# Patient Record
Sex: Male | Born: 1975 | Race: White | Hispanic: No | Marital: Married | Smoking: Never smoker
Health system: Southern US, Community
[De-identification: ages and names within clinical notes are randomized; demographics above are authoritative.]

## PROBLEM LIST (undated history)

## (undated) DIAGNOSIS — N200 Calculus of kidney: Secondary | ICD-10-CM

## (undated) HISTORY — DX: Calculus of kidney: N20.0

## (undated) HISTORY — PX: NO PAST SURGERIES: SHX2092

---

## 2010-11-09 ENCOUNTER — Ambulatory Visit: Payer: Self-pay | Admitting: Family

## 2011-03-11 ENCOUNTER — Emergency Department: Payer: Self-pay | Admitting: Emergency Medicine

## 2011-03-11 LAB — BASIC METABOLIC PANEL
BUN: 15 mg/dL (ref 7–18)
Calcium, Total: 8.9 mg/dL (ref 8.5–10.1)
Chloride: 103 mmol/L (ref 98–107)
Co2: 26 mmol/L (ref 21–32)
Creatinine: 1.01 mg/dL (ref 0.60–1.30)
EGFR (Non-African Amer.): >60
Glucose: 124 mg/dL — ABNORMAL HIGH (ref 65–99)
Osmolality: 280 (ref 275–301)

## 2011-03-11 LAB — CBC
HCT: 46.5 % (ref 40.0–52.0)
HGB: 16.1 g/dL (ref 13.0–18.0)
MCHC: 34.7 g/dL (ref 32.0–36.0)
MCV: 93 fL (ref 80–100)
RDW: 13.2 % (ref 11.5–14.5)
WBC: 15.7 10*3/uL — ABNORMAL HIGH (ref 3.8–10.6)

## 2011-03-11 LAB — URINALYSIS, COMPLETE
Leukocyte Esterase: NEGATIVE
Nitrite: NEGATIVE
Ph: 5 (ref 4.5–8.0)
Protein: 30
RBC,UR: 58 /HPF (ref 0–5)

## 2011-03-15 ENCOUNTER — Ambulatory Visit: Payer: Self-pay | Admitting: Urology

## 2012-03-16 ENCOUNTER — Ambulatory Visit: Payer: Self-pay | Admitting: Urology

## 2012-12-15 ENCOUNTER — Ambulatory Visit: Payer: Self-pay | Admitting: Medical

## 2016-07-30 ENCOUNTER — Encounter: Payer: Self-pay | Admitting: Family Medicine

## 2016-07-30 ENCOUNTER — Ambulatory Visit (INDEPENDENT_AMBULATORY_CARE_PROVIDER_SITE_OTHER): Payer: 59 | Admitting: Family Medicine

## 2016-07-30 VITALS — BP 122/80 | HR 70 | Temp 98.6°F | Ht 68.5 in | Wt 211.5 lb

## 2016-07-30 DIAGNOSIS — D179 Benign lipomatous neoplasm, unspecified: Secondary | ICD-10-CM

## 2016-07-30 DIAGNOSIS — Z7189 Other specified counseling: Secondary | ICD-10-CM

## 2016-07-30 DIAGNOSIS — Z0000 Encounter for general adult medical examination without abnormal findings: Secondary | ICD-10-CM

## 2016-07-30 NOTE — Patient Instructions (Addendum)
Check to see if you had a Td or Tdap shot.  See if you can get me a copy of your old labs.   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   If bothersome then you can get it removed by the surgeons.  Take care.  Glad to see you.  I would get a flu shot each fall.

## 2016-07-31 ENCOUNTER — Encounter: Payer: Self-pay | Admitting: Family Medicine

## 2016-07-31 DIAGNOSIS — Z7189 Other specified counseling: Secondary | ICD-10-CM | POA: Insufficient documentation

## 2016-07-31 DIAGNOSIS — D179 Benign lipomatous neoplasm, unspecified: Secondary | ICD-10-CM | POA: Insufficient documentation

## 2016-07-31 DIAGNOSIS — Z0000 Encounter for general adult medical examination without abnormal findings: Secondary | ICD-10-CM | POA: Insufficient documentation

## 2016-07-31 NOTE — Assessment & Plan Note (Signed)
Advanced directive discussed with patient.  Wife designated if patient were incapacitated. ?

## 2016-07-31 NOTE — Assessment & Plan Note (Signed)
He had a tetanus shot in 2016. He can check to see if it is a TD or a Tdap.  He will check to see when he last had his lipids and sugar checked.  HIV screening discussed with patient. He will consider. Diet and exercise discussed with patient. Flu shot encouraged. Advanced directive discussed with patient. Wife designated if patient were incapacitated.

## 2016-07-31 NOTE — Progress Notes (Signed)
New patient to establish care. Skin lesion noted near the groin. Not painful. Present for 1-2 years. No change in size over that period of time. It does not change in size acutely when he coughs laughs or sneezes. No previous intervention. No previous evaluation. No skin drainage. No known injury.  Health maintenance issues.  He had a tetanus shot in 2016. He can check to see if it is a TD or a Tdap.  He will check to see when he last had his lipids and sugar checked.  HIV screening discussed with patient. He will consider. Diet and exercise discussed with patient. Flu shot encouraged. Advanced directive discussed with patient. Wife designated if patient were incapacitated.  PMH and SH reviewed  ROS: Per HPI unless specifically indicated in ROS section   Meds, vitals, and allergies reviewed.   GEN: nad, alert and oriented HEENT: mucous membranes moist NECK: supple w/o LA CV: rrr.  no murmur PULM: ctab, no inc wob ABD: soft, +bs EXT: no edema SKIN: no acute rash but long noted in the right inguinal/lower suprapubic area. It appears to be limited to the skin. It does not move with cough. . On hernia exam I do not feel the lesion move with cough

## 2016-07-31 NOTE — Assessment & Plan Note (Addendum)
I initially thought this was going to be a hernia but it feels more like a lipoma. It does not have the typical movement I would expect with a hernia. Either way he can observe it for now since it is not painful or causing significant symptoms. If it does become symptomatic we can refer him to general surgeon, that would apply if it were a lipoma or a hernia. Rationale discussed with patient. He agrees and will update me as needed. >30 minutes spent in face to face time with patient, >50% spent in counselling or coordination of care

## 2016-11-05 ENCOUNTER — Ambulatory Visit (INDEPENDENT_AMBULATORY_CARE_PROVIDER_SITE_OTHER): Payer: 59

## 2016-11-05 DIAGNOSIS — Z23 Encounter for immunization: Secondary | ICD-10-CM | POA: Diagnosis not present

## 2018-01-06 ENCOUNTER — Encounter: Payer: Self-pay | Admitting: Family Medicine

## 2018-01-06 ENCOUNTER — Ambulatory Visit: Payer: No Typology Code available for payment source | Admitting: Family Medicine

## 2018-01-06 DIAGNOSIS — R05 Cough: Secondary | ICD-10-CM | POA: Diagnosis not present

## 2018-01-06 DIAGNOSIS — R059 Cough, unspecified: Secondary | ICD-10-CM | POA: Insufficient documentation

## 2018-01-06 MED ORDER — BENZONATATE 200 MG PO CAPS: 200.0000 mg | ORAL_CAPSULE | Freq: Three times a day (TID) | ORAL | 1 refills | Status: DC | PRN

## 2018-01-06 NOTE — Assessment & Plan Note (Addendum)
He clearly feels better today.   Likely with a self resolving illness initially with residual dry cough, then a second illness in the last few days.   Likely a post infectious cough.  Should gradually get better over the next few weeks. Can last 6-8 weeks.   Use tessalon as needed.   Update me as needed.  Routine cautions given.  He agrees.   ddx d/w pt.

## 2018-01-06 NOTE — Patient Instructions (Signed)
Likely a post infectious cough.  Should gradually get better over the next few weeks. Can last 6-8 weeks.   Use tessalon as needed.   Update me as needed.  Take care.  Glad to see you.

## 2018-01-06 NOTE — Progress Notes (Signed)
Cough for about 3 weeks.  Some sputum production with cough initially.  Sputum resolved, cough remained.  No other sx at that point.  He thought it was eventually going to go away, but then had chills on 01/04/18 in the PM.  He hadn't felt sick right before that.  Then had a sweat that night, on 01/04/18.  Temp 99 that night.  He was fatigued 01/05/18, but clearly feels better today.  Still with dry cough.    Now, no FCNAVD.  No sputum production.  Appetite is normal.    Meds, vitals, and allergies reviewed.   ROS: Per HPI unless specifically indicated in ROS section   nad ncat Tm wnl Nasal exam w/o purulent discharge but scant dried blood on R medial nostril.   OP wnl Neck supple, no LA rrr ctab No wheeze, no rales, no rhonchi Ext w/o edema No BLE edema.

## 2019-03-30 ENCOUNTER — Other Ambulatory Visit: Payer: Self-pay | Admitting: Family Medicine

## 2019-03-30 ENCOUNTER — Other Ambulatory Visit: Payer: Self-pay

## 2019-03-30 ENCOUNTER — Other Ambulatory Visit (INDEPENDENT_AMBULATORY_CARE_PROVIDER_SITE_OTHER): Payer: Self-pay

## 2019-03-30 DIAGNOSIS — Z13220 Encounter for screening for lipoid disorders: Secondary | ICD-10-CM

## 2019-03-30 DIAGNOSIS — Z841 Family history of disorders of kidney and ureter: Secondary | ICD-10-CM

## 2019-03-30 LAB — BASIC METABOLIC PANEL
BUN: 16 mg/dL (ref 6–23)
CO2: 27 mEq/L (ref 19–32)
Calcium: 9.4 mg/dL (ref 8.4–10.5)
Chloride: 101 mEq/L (ref 96–112)
Creatinine, Ser: 1.01 mg/dL (ref 0.40–1.50)
GFR: 79.46 mL/min (ref >60.00)
Glucose, Bld: 157 mg/dL — ABNORMAL HIGH (ref 70–99)
Potassium: 4.1 mEq/L (ref 3.5–5.1)
Sodium: 137 mEq/L (ref 135–145)

## 2019-03-30 LAB — LIPID PANEL
Cholesterol: 234 mg/dL — ABNORMAL HIGH (ref 0–200)
HDL: 37.9 mg/dL — ABNORMAL LOW (ref >39.00)
LDL Cholesterol: 158 mg/dL — ABNORMAL HIGH (ref 0–99)
NonHDL: 195.93
Total CHOL/HDL Ratio: 6
Triglycerides: 191 mg/dL — ABNORMAL HIGH (ref 0.0–149.0)
VLDL: 38.2 mg/dL (ref 0.0–40.0)

## 2019-04-06 ENCOUNTER — Encounter: Payer: Self-pay | Admitting: Family Medicine

## 2019-04-06 ENCOUNTER — Other Ambulatory Visit: Payer: Self-pay

## 2019-04-06 ENCOUNTER — Ambulatory Visit (INDEPENDENT_AMBULATORY_CARE_PROVIDER_SITE_OTHER): Payer: BC Managed Care – PPO | Admitting: Family Medicine

## 2019-04-06 VITALS — BP 130/76 | HR 96 | Temp 96.4°F | Ht 68.5 in | Wt 219.2 lb

## 2019-04-06 DIAGNOSIS — Z0000 Encounter for general adult medical examination without abnormal findings: Secondary | ICD-10-CM | POA: Diagnosis not present

## 2019-04-06 DIAGNOSIS — K5900 Constipation, unspecified: Secondary | ICD-10-CM

## 2019-04-06 DIAGNOSIS — R739 Hyperglycemia, unspecified: Secondary | ICD-10-CM | POA: Diagnosis not present

## 2019-04-06 DIAGNOSIS — Z9189 Other specified personal risk factors, not elsewhere classified: Secondary | ICD-10-CM

## 2019-04-06 DIAGNOSIS — D179 Benign lipomatous neoplasm, unspecified: Secondary | ICD-10-CM

## 2019-04-06 DIAGNOSIS — Z7189 Other specified counseling: Secondary | ICD-10-CM

## 2019-04-06 LAB — GLUCOSE, RANDOM: Glucose, Bld: 161 mg/dL — ABNORMAL HIGH (ref 70–99)

## 2019-04-06 LAB — HEMOGLOBIN A1C: Hgb A1c MFr Bld: 5.6 % (ref 4.6–6.5)

## 2019-04-06 NOTE — Progress Notes (Signed)
This visit occurred during the SARS-CoV-2 public health emergency.  Safety protocols were in place, including screening questions prior to the visit, additional usage of staff PPE, and extensive cleaning of exam room while observing appropriate contact time as indicated for disinfecting solutions.  CPE- See plan.  Routine anticipatory guidance given to patient.  See health maintenance.  The possibility exists that previously documented standard health maintenance information may have been brought forward from a previous encounter into this note.  If needed, that same information has been updated to reflect the current situation based on today's encounter.    He had a tetanus shot in 2016. Flu shot encouraged. PNA and shingles not due Colon and prostate cancer screening not due, d/w pt.   HIV and HCV screening discussed with patient. He will consider. Diet and exercise discussed with patient.  Encouraged.  covid vaccine d/w pt.   Advanced directive discussed with patient. Wife designated if patient were incapacitated.  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    He has been trying conceive for the last 18 months.  He also has some ED noted.  He and his wife have a 36.70 year old son.    Hyperglycemia noted on labs.  He wasn't fasting for labs.  D/w pt.    Constipation with relief from miralax.  No blood in stool.  No abd pain.    PMH and SH reviewed  Meds, vitals, and allergies reviewed.   ROS: Per HPI.  Unless specifically indicated otherwise in HPI, the patient denies:  General: fever. Eyes: acute vision changes ENT: sore throat Cardiovascular: chest pain Respiratory: SOB GI: vomiting GU: dysuria Musculoskeletal: acute back pain Derm: acute rash Neuro: acute motor dysfunction Psych: worsening mood Endocrine: polydipsia Heme: bleeding Allergy: hayfever  GEN: nad, alert and oriented HEENT: ncat NECK: supple w/o LA CV: rrr. 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

## 2019-04-06 NOTE — Patient Instructions (Signed)
Go to the lab on the way out.   If you have mychart we'll likely use that to update you.    We'll call about the urology referral.  You may also get a call from the urology office.  Keep working on diet and exercise.   Your pulse was okay of recheck.   Take care.  Glad to see you.

## 2019-04-07 DIAGNOSIS — K5900 Constipation, unspecified: Secondary | ICD-10-CM | POA: Insufficient documentation

## 2019-04-07 DIAGNOSIS — R739 Hyperglycemia, unspecified: Secondary | ICD-10-CM | POA: Insufficient documentation

## 2019-04-07 DIAGNOSIS — Z9189 Other specified personal risk factors, not elsewhere classified: Secondary | ICD-10-CM | POA: Insufficient documentation

## 2019-04-07 NOTE — Assessment & Plan Note (Signed)
Advanced directive discussed with patient.  Wife designated if patient were incapacitated. ?

## 2019-04-07 NOTE — Assessment & Plan Note (Signed)
Sounds like he had relatively mild symptoms.  Continue work on diet and exercise and continue with as needed MiraLAX use.  Update me as needed.

## 2019-04-07 NOTE — Assessment & Plan Note (Signed)
He had a tetanus shot in 2016. Flu shot encouraged. PNA and shingles not due Colon and prostate cancer screening not due, d/w pt.   HIV and HCV screening discussed with patient. He will consider. Diet and exercise discussed with patient.  Encouraged.  covid vaccine d/w pt.   Advanced directive discussed with patient. Wife designated if patient were incapacitated.

## 2019-04-07 NOTE — Assessment & Plan Note (Signed)
Refer to urology.  He has mild ED noted.  Not at the point of needing treatment.  Discussed options.

## 2019-04-07 NOTE — Assessment & Plan Note (Signed)
Reassuring exam.  Update me as needed.  He agrees.

## 2019-04-07 NOTE — Assessment & Plan Note (Signed)
Discussed diet and exercise.  See notes on labs.

## 2019-04-08 ENCOUNTER — Telehealth: Payer: Self-pay | Admitting: Family Medicine

## 2019-04-08 DIAGNOSIS — Z9189 Other specified personal risk factors, not elsewhere classified: Secondary | ICD-10-CM

## 2019-04-08 NOTE — Telephone Encounter (Signed)
Received request from [facility] for Semen analysis results done from PCP before an appointment can be scheduled with Urologist. New patient appt was cancelled by [facility]. Request from [facility] placed in Dr Gutierrez's in box.

## 2019-04-08 NOTE — Telephone Encounter (Signed)
Analysis ordered to [facility] future order.  Results will determine [facility] vs [facility].

## 2019-05-03 DIAGNOSIS — Z9189 Other specified personal risk factors, not elsewhere classified: Secondary | ICD-10-CM | POA: Diagnosis not present

## 2019-05-03 LAB — SEMEN ANALYSIS, BASIC
Appearance: NORMAL
Concentration, Sperm: 73.3 x10E6/mL (ref >14.9)
Immotile Sperm: 72 %
Leukocyte Concentration: <1 x10E6/mL (ref <1.00)
Non-Progressive (NP): 9 %
Normal Morphology-Strict: 4 % (ref >3)
Progressive Motility (PR): 19 % — ABNORMAL LOW (ref >31)
Progressively Motile Sperm: 78.5 x10E6
Time Collected: 1420
Time Received: 1450
Time Since Last Emission: 10 days
Total Motile Sperm: 111.6 x10E6
Total Motility (PR+NP): 28 % — ABNORMAL LOW (ref >39)
Total Sperm in Ejaculate: 402.9 x10E6 (ref >38.9)
Volume: 5.5 mL (ref >1.4)
pH: 9 (ref >7.1)

## 2019-05-03 LAB — VIABILITY: Viability: 33 % — ABNORMAL LOW (ref >57)

## 2019-05-05 ENCOUNTER — Other Ambulatory Visit: Payer: Self-pay | Admitting: Family Medicine

## 2019-05-05 DIAGNOSIS — R869 Unspecified abnormal finding in specimens from male genital organs: Secondary | ICD-10-CM

## 2019-05-14 ENCOUNTER — Ambulatory Visit: Payer: Self-pay | Admitting: Urology

## 2019-05-18 ENCOUNTER — Other Ambulatory Visit: Payer: Self-pay | Admitting: Family Medicine

## 2019-05-18 DIAGNOSIS — R869 Unspecified abnormal finding in specimens from male genital organs: Secondary | ICD-10-CM

## 2019-05-28 ENCOUNTER — Ambulatory Visit: Payer: Self-pay | Admitting: Urology

## 2019-07-20 DIAGNOSIS — Z3141 Encounter for fertility testing: Secondary | ICD-10-CM | POA: Diagnosis not present

## 2019-07-20 DIAGNOSIS — E291 Testicular hypofunction: Secondary | ICD-10-CM | POA: Diagnosis not present

## 2019-08-25 DIAGNOSIS — I861 Scrotal varices: Secondary | ICD-10-CM | POA: Diagnosis not present

## 2019-10-12 DIAGNOSIS — Z20822 Contact with and (suspected) exposure to covid-19: Secondary | ICD-10-CM | POA: Diagnosis not present

## 2019-10-15 ENCOUNTER — Ambulatory Visit
Admission: EM | Admit: 2019-10-15 | Discharge: 2019-10-15 | Disposition: A | Discharge status: Home / Self Care | Payer: BC Managed Care – PPO | Attending: Family Medicine | Admitting: Family Medicine

## 2019-10-15 ENCOUNTER — Other Ambulatory Visit: Payer: Self-pay | Admitting: Primary Care

## 2019-10-15 ENCOUNTER — Other Ambulatory Visit: Payer: Self-pay

## 2019-10-15 ENCOUNTER — Telehealth: Payer: Self-pay

## 2019-10-15 DIAGNOSIS — U071 COVID-19: Secondary | ICD-10-CM | POA: Diagnosis not present

## 2019-10-15 DIAGNOSIS — R0602 Shortness of breath: Secondary | ICD-10-CM | POA: Diagnosis not present

## 2019-10-15 DIAGNOSIS — R5383 Other fatigue: Secondary | ICD-10-CM | POA: Diagnosis not present

## 2019-10-15 MED ORDER — PREDNISONE 10 MG (21) PO TBPK: ORAL_TABLET | Freq: Every day | ORAL | 0 refills | Status: AC

## 2019-10-15 MED ORDER — BENZONATATE 100 MG PO CAPS: 100.0000 mg | ORAL_CAPSULE | Freq: Three times a day (TID) | ORAL | 0 refills | Status: DC

## 2019-10-15 NOTE — Progress Notes (Signed)
I connected by phone with Kenneth White on 10/15/2019 at 5:29 PM to discuss the potential use of a new treatment for mild to moderate COVID-19 viral infection in non-hospitalized patients.  This patient is a 44 y.o. male that meets the FDA criteria for Emergency Use Authorization of COVID monoclonal antibody casirivimab/imdevimab or bamlanivimab/eteseviamb.  Has a (+) direct SARS-CoV-2 viral test result  Has mild or moderate COVID-19   Is NOT hospitalized due to COVID-19  Is within 10 days of symptom onset  Has at least one of the high risk factor(s) for progression to severe COVID-19 and/or hospitalization as defined in EUA.  Specific high risk criteria : BMI > 25   I have spoken and communicated the following to the patient or parent/caregiver regarding COVID monoclonal antibody treatment:  1. FDA has authorized the emergency use for the treatment of mild to moderate COVID-19 in adults and pediatric patients with positive results of direct SARS-CoV-2 viral testing who are 10 years of age and older weighing at least 40 kg, and who are at high risk for progressing to severe COVID-19 and/or hospitalization.  2. The significant known and potential risks and benefits of COVID monoclonal antibody, and the extent to which such potential risks and benefits are unknown.  3. Information on available alternative treatments and the risks and benefits of those alternatives, including clinical trials.  4. Patients treated with COVID monoclonal antibody should continue to self-isolate and use infection control measures (e.g., wear mask, isolate, social distance, avoid sharing personal items, clean and disinfect "high touch" surfaces, and frequent handwashing) according to CDC guidelines.   5. The patient or parent/caregiver has the option to accept or refuse COVID monoclonal antibody treatment.  After reviewing this information with the patient, the patient has agreed to receive one of the available  covid 19 monoclonal antibodies and will be provided an appropriate fact sheet prior to infusion. Pleas Koch, NP 10/15/2019 5:29 PM

## 2019-10-15 NOTE — Telephone Encounter (Signed)
Noted.  Thanks.  rx sent to staff re: possible infusion tx.

## 2019-10-15 NOTE — Telephone Encounter (Signed)
Noted  

## 2019-10-15 NOTE — Discharge Instructions (Signed)
I have sent in a prednisone taper for you to take for 6 days. 6 tablets on day one, 5 tablets on day two, 4 tablets on day three, 3 tablets on day four, 2 tablets on day five, and 1 tablet on day six.  I have also sent in [facility] for you to take 1 capsule every 8 hours as needed for cough  Your COVID test is pending.  You should self quarantine until the test result is back.    Take Tylenol as needed for fever or discomfort.  Rest and keep yourself hydrated.    Go to the emergency department if you develop acute worsening symptoms.

## 2019-10-15 NOTE — ED Provider Notes (Signed)
[place]   [id number] 10/15/19 Arrival Time: 2992   CC: COVID symptoms  SUBJECTIVE: History from: patient.  Kenneth White is a 44 y.o. male who presents with abrupt onset of fatigue, cough, tachycardia, shortness of breath for the last 3 days. Reports that he had a Covid test 4 days ago which resulted positive yesterday. Denies sick exposure to COVID, flu or strep. Denies recent travel. Currently has Covid. Has not completed Covid vaccines. Has not taken OTC medications for this. Symptoms are aggravated by activity. Denies previous symptoms in the past. Denies fever, chills, sinus pain, rhinorrhea, sore throat, wheezing, chest pain, nausea, changes in bowel or bladder habits.    ROS: As per HPI.  All other pertinent ROS negative.     Past Medical History:  Diagnosis Date  . Renal stone    Past Surgical History:  Procedure Laterality Date  . NO PAST SURGERIES     No Known Allergies No current facility-administered medications on file prior to encounter.   Current Outpatient Medications on File Prior to Encounter  Medication Sig Dispense Refill  . acetaminophen (TYLENOL) 500 MG tablet Take 500 mg by mouth every 6 (six) hours as needed.     Social History   Socioeconomic History  . Marital status: Unknown    Spouse name: Not on file  . Number of children: Not on file  . Years of education: Not on file  . Highest education level: Not on file  Occupational History  . Not on file  Tobacco Use  . Smoking status: Never Smoker  . Smokeless tobacco: Never Used  Substance and Sexual Activity  . Alcohol use: No  . Drug use: No  . Sexual activity: Not on file  Other Topics Concern  . Not on file  Social History Narrative   Remarried 2016. Son Zac born August 2017. 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   [employer] graduate.   Fan of [employer],  [employer]   Enjoys playing soccer   Social Determinants of Health   Financial Resource Strain:   . Difficulty of Paying Living Expenses: Not on file  Food Insecurity:   . Worried About Charity fundraiser in the Last Year: Not on file  . Ran Out of Food in the Last Year: Not on file  Transportation Needs:   . Lack of Transportation (Medical): Not on file  . Lack of Transportation (Non-Medical): Not on file  Physical Activity:   . Days of Exercise per Week: Not on file  . Minutes of Exercise per Session: Not on file  Stress:   . Feeling of Stress : Not on file  Social Connections:   . Frequency of Communication with Friends and Family: Not on file  . Frequency of Social Gatherings with Friends and Family: Not on file  . Attends Religious Services: Not on file  . Active Member of Clubs or Organizations: Not on file  . Attends Archivist Meetings: Not on file  . Marital Status: Not on file  Intimate Partner Violence:   . Fear of Current or Ex-Partner: Not on file  . Emotionally Abused: Not on file  . Physically Abused: Not on file  . Sexually Abused: Not on file   Family History  Problem Relation Age of Onset  . AAA (abdominal aortic aneurysm) Father   .  Renal Disease Father   . Hyperlipidemia Father   . Hypertension Father   . Colon cancer Neg Hx   . Prostate cancer Neg Hx     OBJECTIVE:  Vitals:   10/15/19 1101  BP: (!) 141/94  Pulse: (!) 113  Resp: 19  Temp: 99.7 F (37.6 C)  SpO2: 93%     General appearance: alert; appears fatigued, but nontoxic; speaking in full sentences and tolerating own secretions HEENT: NCAT; Ears: EACs clear, TMs pearly gray; Eyes: PERRL.  EOM grossly intact. Sinuses: nontender; Nose: nares patent without rhinorrhea, Throat: oropharynx clear, tonsils non erythematous or enlarged, uvula midline  Neck: supple without LAD Lungs: unlabored respirations, symmetrical air entry; cough: moderate; no respiratory distress;  CTAB Heart:tachycardic with regular rhythm. Radial pulses 2+ symmetrical bilaterally Skin: warm and dry Psychological: alert and cooperative; normal mood and affect  LABS:  No results found for this or any previous visit (from the past 24 hour(s)).   ASSESSMENT & PLAN:  1. COVID-19   2. Other fatigue   3. SOB (shortness of breath)     Meds ordered this encounter  Medications  . predniSONE (STERAPRED UNI-PAK 21 TAB) 10 MG (21) TBPK tablet    Sig: Take by mouth daily for 6 days. Take 6 tablets on day 1, 5 tablets on day 2, 4 tablets on day 3, 3 tablets on day 4, 2 tablets on day 5, 1 tablet on day 6    Dispense:  21 tablet    Refill:  0    Order Specific Question:   Supervising Provider    Answer:   Chase Picket [id number]  . benzonatate (TESSALON) 100 MG capsule    Sig: Take 1 capsule (100 mg total) by mouth every 8 (eight) hours.    Dispense:  21 capsule    Refill:  0    Order Specific Question:   Supervising Provider    Answer:   Chase Picket [id number]    Prescribed steroid taper Prescribed Tessalon Perles Gave incentive spirometer to help maintain lung conditioning Work note provided COVID testing ordered.  It will take between 1-2 days for test results.  Someone will contact you regarding abnormal results.    Patient should remain in quarantine until they have received Covid results.  If negative you may resume normal activities (go back to work/school) while practicing hand hygiene, social distance, and mask wearing.  If positive, patient should remain in quarantine for 10 days from symptom onset AND greater than 72 hours after symptoms resolution (absence of fever without the use of fever-reducing medication and improvement in respiratory symptoms), whichever is longer Get plenty of rest and push fluids Use OTC zyrtec for nasal congestion, runny nose, and/or sore throat Use OTC flonase for nasal congestion and runny nose Use medications daily for symptom  relief Use OTC medications like ibuprofen or tylenol as needed fever or pain Call or go to the ED if you have any new or worsening symptoms such as fever, worsening cough, shortness of breath, chest tightness, chest pain, turning blue, changes in mental status.  Reviewed expectations re: course of current medical issues. Questions answered. Outlined signs and symptoms indicating need for more acute intervention. Patient verbalized understanding. After Visit Summary given.         Faustino Congress, NP 10/16/19 1512

## 2019-10-15 NOTE — Telephone Encounter (Signed)
Pt seen at [facility] placed on prednisone course. Anda Kraft has called to discuss mAb infusion.

## 2019-10-15 NOTE — ED Triage Notes (Signed)
Patient sent here by [facility].   Patient reports he did a PCR covid test at [facility] on 9/29, which came back positive today. Patient called his primary care office, who instructed him to come to [facility].

## 2019-10-15 NOTE — Telephone Encounter (Signed)
Noted. Would call for update on [facility] eval. If didn't go, and not dyspneic may be ok to manage at home without needing [facility] eval.  Would offer mAb infusion as he would qualify based on weight.

## 2019-10-15 NOTE — Telephone Encounter (Signed)
Pt left v/m that he tested + for covid on 10/13/19 with [facility]; pt did not do rapid test but did do the 2 day test. Pt had fever 101 on 10/14/19; pt took tylenol and fever went down; pt has not taken temp today. On 10/09/19 pt started with dry cough and today has prod cough with yellow phlegm; pt taking zinc and Vit D and mucinex DM; pt has loss of smell and diarrhea recently. No CP or SOB or wheezing. In last 14 days no know exposure to + covid; pt has not had vaccines for covid; No distress with breathing. No available appts at [facility] and pt will go to [facility] in [place] so can have face to face and eval and listen to pts lungs; pt was advised to drink plenty of fluids, rest,tylenol for fever and self quarantining for 14 days. Pt voiced understanding. Pt will call first of wk with update on condition. Sending note to Dr Damita Dunnings as PCP and DR G who is in office.

## 2019-10-16 LAB — SARS-COV-2, NAA 2 DAY TAT

## 2019-10-16 LAB — NOVEL CORONAVIRUS, NAA: SARS-CoV-2, NAA: DETECTED — AB

## 2019-10-17 ENCOUNTER — Ambulatory Visit (HOSPITAL_COMMUNITY)
Admission: RE | Admit: 2019-10-17 | Discharge: 2019-10-17 | Disposition: A | Discharge status: Home / Self Care | Payer: BC Managed Care – PPO | Source: Ambulatory Visit | Attending: Pulmonary Disease | Admitting: Pulmonary Disease

## 2019-10-17 DIAGNOSIS — U071 COVID-19: Secondary | ICD-10-CM | POA: Diagnosis not present

## 2019-10-17 MED ORDER — METHYLPREDNISOLONE SODIUM SUCC 125 MG IJ SOLR: 125.0000 mg | Freq: Once | INTRAMUSCULAR | Status: DC | PRN

## 2019-10-17 MED ORDER — EPINEPHRINE 0.3 MG/0.3ML IJ SOAJ: 0.3000 mg | Freq: Once | INTRAMUSCULAR | Status: DC | PRN

## 2019-10-17 MED ORDER — SODIUM CHLORIDE 0.9 % IV SOLN
1200.0000 mg | Freq: Once | INTRAVENOUS | Status: AC
  Administered 2019-10-17: 1200 mg via INTRAVENOUS

## 2019-10-17 MED ORDER — FAMOTIDINE IN NACL 20-0.9 MG/50ML-% IV SOLN: 20.0000 mg | Freq: Once | INTRAVENOUS | Status: DC | PRN

## 2019-10-17 MED ORDER — DIPHENHYDRAMINE HCL 50 MG/ML IJ SOLN: 50.0000 mg | Freq: Once | INTRAMUSCULAR | Status: DC | PRN

## 2019-10-17 MED ORDER — ALBUTEROL SULFATE HFA 108 (90 BASE) MCG/ACT IN AERS: 2.0000 | INHALATION_SPRAY | Freq: Once | RESPIRATORY_TRACT | Status: DC | PRN

## 2019-10-17 MED ORDER — SODIUM CHLORIDE 0.9 % IV SOLN: INTRAVENOUS | Status: DC | PRN

## 2019-10-17 NOTE — Progress Notes (Signed)
  Diagnosis: COVID-19  Physician: Dr. Joya Gaskins   Procedure: Covid Infusion Clinic Med: casirivimab\imdevimab infusion - Provided patient with casirivimab\imdevimab fact sheet for patients, parents and caregivers prior to infusion.  Complications: No immediate complications noted.  Discharge: Discharged home   Claudia Desanctis 10/17/2019

## 2019-10-17 NOTE — Discharge Instructions (Signed)

## 2019-10-18 ENCOUNTER — Telehealth: Payer: Self-pay | Admitting: *Deleted

## 2019-10-18 NOTE — Telephone Encounter (Signed)
Spoke with pt relaying Dr. Synthia Innocent message.  Pt verbalizes understanding and will continue prednisone.  States he is feeling a lot better since the infusion.

## 2019-10-18 NOTE — Telephone Encounter (Signed)
Patient called stating that he went to the Urgent Care and was told to call back to let you know how the visit went. Patient stated that he was told that his lungs sound good. Patient stated that he went to the UC because he was given Prednisone and his heart rate went up. Patient stated that he is on Prednisone and wants to know if he can stop it or should he continue it?

## 2019-10-18 NOTE — Telephone Encounter (Signed)
Noted. Thanks.

## 2019-10-18 NOTE — Telephone Encounter (Signed)
Racing heart can be a common side effect of the prednisone or it could be the COVID causing fast heart rate. Would probably recommend continuing prednisone prescribed by [facility].   How is he feeling from [place]?

## 2019-10-18 NOTE — Telephone Encounter (Signed)
Fyi to PCP.

## 2019-10-23 DIAGNOSIS — Z20822 Contact with and (suspected) exposure to covid-19: Secondary | ICD-10-CM | POA: Diagnosis not present

## 2019-12-02 DIAGNOSIS — Z20822 Contact with and (suspected) exposure to covid-19: Secondary | ICD-10-CM | POA: Diagnosis not present

## 2021-05-07 ENCOUNTER — Ambulatory Visit (INDEPENDENT_AMBULATORY_CARE_PROVIDER_SITE_OTHER): Payer: 59 | Admitting: Nurse Practitioner

## 2021-05-07 ENCOUNTER — Ambulatory Visit (INDEPENDENT_AMBULATORY_CARE_PROVIDER_SITE_OTHER)
Admission: RE | Admit: 2021-05-07 | Discharge: 2021-05-07 | Disposition: A | Discharge status: Home / Self Care | Payer: 59 | Source: Ambulatory Visit | Attending: Nurse Practitioner | Admitting: Nurse Practitioner

## 2021-05-07 ENCOUNTER — Encounter: Payer: Self-pay | Admitting: Nurse Practitioner

## 2021-05-07 VITALS — BP 144/94 | HR 90 | Temp 96.9°F | Resp 14 | Ht 68.5 in | Wt 221.5 lb

## 2021-05-07 DIAGNOSIS — M79675 Pain in left toe(s): Secondary | ICD-10-CM | POA: Diagnosis not present

## 2021-05-07 DIAGNOSIS — M79672 Pain in left foot: Secondary | ICD-10-CM

## 2021-05-07 LAB — COMPREHENSIVE METABOLIC PANEL
ALT: 69 U/L — ABNORMAL HIGH (ref 0–53)
AST: 29 U/L (ref 0–37)
Albumin: 4.3 g/dL (ref 3.5–5.2)
Alkaline Phosphatase: 96 U/L (ref 39–117)
BUN: 15 mg/dL (ref 6–23)
CO2: 28 mEq/L (ref 19–32)
Calcium: 9.5 mg/dL (ref 8.4–10.5)
Chloride: 102 mEq/L (ref 96–112)
Creatinine, Ser: 0.91 mg/dL (ref 0.40–1.50)
GFR: 99.8 mL/min (ref >60.00)
Glucose, Bld: 95 mg/dL (ref 70–99)
Potassium: 4.2 mEq/L (ref 3.5–5.1)
Sodium: 139 mEq/L (ref 135–145)
Total Bilirubin: 0.8 mg/dL (ref 0.2–1.2)
Total Protein: 7.4 g/dL (ref 6.0–8.3)

## 2021-05-07 LAB — CBC
HCT: 44.7 % (ref 39.0–52.0)
Hemoglobin: 15.6 g/dL (ref 13.0–17.0)
MCHC: 34.9 g/dL (ref 30.0–36.0)
MCV: 89.7 fl (ref 78.0–100.0)
Platelets: 336 10*3/uL (ref 150.0–400.0)
RBC: 4.98 Mil/uL (ref 4.22–5.81)
RDW: 13.8 % (ref 11.5–15.5)
WBC: 8 10*3/uL (ref 4.0–10.5)

## 2021-05-07 LAB — URIC ACID: Uric Acid, Serum: 7.6 mg/dL (ref 4.0–7.8)

## 2021-05-07 NOTE — Assessment & Plan Note (Signed)
Without discrete injury.  This is likely gout.  Discussed this with patient.  Did discuss the treatment of choice would be prednisone 40 mg for several days.  Patient states he does not tolerate prednisone well would like to defer treatment until clinical data is present.  He continue taking ibuprofen as directed on the bottle pending lab and x-ray results. ?

## 2021-05-07 NOTE — Patient Instructions (Signed)
Nice to see you today ?I will be in touch with the xray and lab results once I have them ?Follow up if no improvement ?Ok to continue ibuprofen for now ?

## 2021-05-07 NOTE — Progress Notes (Signed)
? ?Acute Office Visit ? ?Subjective:  ? ?  ?Patient ID: Kenneth White, male    DOB: [birth year], 46 y.o.   MRN: [id number] ? ?Chief Complaint  ?Patient presents with  ? Foot Pain  ?  Started on 05/04/21-Left foot pain-mainly around the ball of the foot, swelling, more pain now, some redness present. Not sure if there were any injuries/trauma to the area.  ? ? ? ?Patient is in today for foot pain ? 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 States was moving heavy stuff around. No know injury. Noticed that on the way back that it started to hurt. States the next morning it hurt with pressure. States hurts with weight bearing. States that he is taking ibuprofen that seems to help. States no brusing. Some reddness and warmth ? ?Review of Systems  ?Constitutional:  Negative for chills and fever.  ?Gastrointestinal:  Negative for nausea and vomiting.  ?Musculoskeletal:  Positive for joint pain.  ?Skin:   ?     Erythema ?  ?Neurological:  Negative for tingling and weakness.  ? ? ?   ?Objective:  ?  ?BP (!) 144/94   Pulse 90   Temp (!) 96.9 ?F (36.1 ?C)   Resp 14   Ht 5' 8.5" (1.74 m)   Wt 221 lb 8 oz (100.5 kg)   SpO2 97%   BMI 33.19 kg/m?  ? ? ?Physical Exam ?Vitals and nursing note reviewed.  ?Constitutional:   ?   Appearance: Normal appearance.  ?Cardiovascular:  ?   Rate and Rhythm: Normal rate and regular rhythm.  ?   Pulses:     ?     Dorsalis pedis pulses are 2+ on the right side and 2+ on the left side.  ?   Heart sounds: Normal heart sounds.  ?Pulmonary:  ?   Effort: Pulmonary effort is normal.  ?   Breath sounds: Normal breath sounds.  ?Abdominal:  ?   General: Bowel sounds are normal.  ?Musculoskeletal:     ?   General: Swelling and tenderness present.  ?     Feet: ? ?Skin: ?   General: Skin is warm.  ?   Findings: Erythema present.  ?Neurological:  ?   General: No focal deficit present.  ?   Mental Status: He is alert.  ? ? ?No results found for any visits on 05/07/21. ? ? ?   ?Assessment &  Plan:  ? ?Problem List Items Addressed This Visit   ? ?  ? Other  ? Great toe pain, left - Primary  ?  Without discrete injury.  This is likely gout.  Discussed this with patient.  Did discuss the treatment of choice would be prednisone 40 mg for several days.  Patient states he does not tolerate prednisone well would like to defer treatment until clinical data is present.  He continue taking ibuprofen as directed on the bottle pending lab and x-ray results. ? ?  ?  ? Relevant Orders  ? CBC  ? Comprehensive metabolic panel  ? Uric acid  ? DG Foot Complete Left  ? Left foot pain  ?  No Ecchymosis or discrete injury pending x-rays ? ?  ?  ? Relevant Orders  ? CBC  ? Comprehensive metabolic panel  ? Uric acid  ? DG Foot Complete Left  ? ? ?No orders of the defined types were placed in this encounter. ? ? ?Return if symptoms worsen or fail to  improve. ? ?Romilda Garret, NP ? ? ?

## 2021-05-07 NOTE — Assessment & Plan Note (Signed)
No Ecchymosis or discrete injury pending x-rays ?

## 2021-05-08 ENCOUNTER — Encounter: Payer: Self-pay | Admitting: Nurse Practitioner

## 2021-05-08 ENCOUNTER — Ambulatory Visit: Payer: BC Managed Care – PPO | Admitting: Family Medicine

## 2021-05-08 DIAGNOSIS — M79675 Pain in left toe(s): Secondary | ICD-10-CM

## 2021-05-08 MED ORDER — METHYLPREDNISOLONE 4 MG PO TBPK: ORAL_TABLET | ORAL | 0 refills | Status: DC

## 2021-05-08 NOTE — Telephone Encounter (Signed)
Patient following up re: message below, would like the prednisone to [place]  ?

## 2021-07-26 ENCOUNTER — Ambulatory Visit (INDEPENDENT_AMBULATORY_CARE_PROVIDER_SITE_OTHER): Payer: 59 | Admitting: Family Medicine

## 2021-07-26 ENCOUNTER — Encounter: Payer: Self-pay | Admitting: Family Medicine

## 2021-07-26 DIAGNOSIS — R109 Unspecified abdominal pain: Secondary | ICD-10-CM | POA: Diagnosis not present

## 2021-07-26 MED ORDER — FAMOTIDINE 20 MG PO TABS: 20.0000 mg | ORAL_TABLET | Freq: Two times a day (BID) | ORAL | Status: DC

## 2021-07-26 NOTE — Progress Notes (Signed)
He had a GI illness in March, diarrhea.  After that he had some constipation.  Then had abd pain.  Intermittent sx in the meantime, often at night.  Some discomfort that he thought was heartburn but then changes to R sided abd discomfort.  He would feel better after burping, passing gas, having a BM.  No FCNAVD.  No blood in stool.  Took miralax and that helped some.  Discomfort is usually at night but not every night.  No back pain.  No dysuria.  H/o renal stones and this feels different.  No nsaids usually, only rare use.  No black stools.  He can have sx even w/o a fatty meal prior.    Meds, vitals, and allergies reviewed.  ROS: Per HPI unless specifically indicated in ROS section   GEN: nad, alert and oriented HEENT: ncat NECK: supple w/o LA CV: rrr.  PULM: ctab, no inc wob ABD: soft, +bs EXT: no edema SKIN: no acute rash

## 2021-07-26 NOTE — Patient Instructions (Addendum)
Pepcid twice a day, elevate the head of your bed and try a probiotic.   Update me if not better.  Take care.  Glad to see you.

## 2021-07-29 DIAGNOSIS — R109 Unspecified abdominal pain: Secondary | ICD-10-CM | POA: Insufficient documentation

## 2021-07-29 NOTE — Assessment & Plan Note (Signed)
Discussed options.  Benign abdominal exam.  Okay for outpatient follow-up.  Would try taking Pepcid twice a day, elevate the head of his bed and try a probiotic.   Update me if not better.

## 2022-04-04 ENCOUNTER — Encounter: Payer: Self-pay | Admitting: Family Medicine

## 2022-04-04 ENCOUNTER — Ambulatory Visit (INDEPENDENT_AMBULATORY_CARE_PROVIDER_SITE_OTHER): Payer: 59 | Admitting: Family Medicine

## 2022-04-04 VITALS — BP 136/78 | HR 74 | Temp 97.6°F | Ht 68.5 in | Wt 220.2 lb

## 2022-04-04 DIAGNOSIS — B880 Other acariasis: Secondary | ICD-10-CM | POA: Insufficient documentation

## 2022-04-04 MED ORDER — PREDNISONE 20 MG PO TABS: ORAL_TABLET | ORAL | 0 refills | Status: DC

## 2022-04-04 NOTE — Progress Notes (Signed)
Patient ID: Kenneth White, male    DOB: 09-13-1975, 47 y.o.   MRN: [id number]  This visit was conducted in person.  BP 136/78   Pulse 74   Temp 97.6 F (36.4 C) (Temporal)   Ht 5' 8.5" (1.74 m)   Wt 220 lb 4 oz (99.9 kg)   SpO2 98%   BMI 33.00 kg/m    CC:  Chief Complaint  Patient presents with   Insect Bite    C/o bug bites on B feet. Noticed 03/31/22 after mowing grass on 03/30/22. Has redness, firmness, itching and some areas are blistering. Thinks may be chigger or spider bites.     Subjective:   HPI: Kenneth White is a 47 y.o. male presenting on 04/04/2022 for Insect Bite (C/o bug bites on B feet. Noticed 03/31/22 after mowing grass on 03/30/22. Has redness, firmness, itching and some areas are blistering. Thinks may be chigger or spider bites. )    After 3/16 mowing lawn ( socks, tall grass)... noted itching at feet ankles... then noted red bumps.. progressed to orange discharge.  Has firm skin around it.   No bug sited.   Has been placng neosporin.   No fever, no flu like symptoms       Relevant past medical, surgical, family and social history reviewed and updated as indicated. Interim medical history since our last visit reviewed. Allergies and medications reviewed and updated. Outpatient Medications Prior to Visit  Medication Sig Dispense Refill   acetaminophen (TYLENOL) 500 MG tablet Take 500 mg by mouth every 6 (six) hours as needed.     famotidine (PEPCID) 20 MG tablet Take 1 tablet (20 mg total) by mouth 2 (two) times daily.     OVER THE COUNTER MEDICATION CONCEPTIONXR-REPRODUCTIVE FORMULA     No facility-administered medications prior to visit.     Per HPI unless specifically indicated in ROS section below Review of Systems  Constitutional:  Negative for fatigue and fever.  HENT:  Negative for ear pain.   Eyes:  Negative for pain.  Respiratory:  Negative for cough and shortness of breath.   Cardiovascular:  Negative for chest pain, palpitations  and leg swelling.  Gastrointestinal:  Negative for abdominal pain.  Genitourinary:  Negative for dysuria.  Musculoskeletal:  Negative for arthralgias.  Neurological:  Negative for syncope, light-headedness and headaches.  Psychiatric/Behavioral:  Negative for dysphoric mood.    Objective:  BP 136/78   Pulse 74   Temp 97.6 F (36.4 C) (Temporal)   Ht 5' 8.5" (1.74 m)   Wt 220 lb 4 oz (99.9 kg)   SpO2 98%   BMI 33.00 kg/m   Wt Readings from Last 3 Encounters:  04/04/22 220 lb 4 oz (99.9 kg)  07/26/21 222 lb (100.7 kg)  05/07/21 221 lb 8 oz (100.5 kg)      Physical Exam Constitutional:      Appearance: He is well-developed.  HENT:     Head: Normocephalic.     Right Ear: Hearing normal.     Left Ear: Hearing normal.     Nose: Nose normal.  Neck:     Thyroid: No thyroid mass or thyromegaly.     Vascular: No carotid bruit.     Trachea: Trachea normal.  Cardiovascular:     Rate and Rhythm: Normal rate and regular rhythm.     Pulses: Normal pulses.     Heart sounds: Heart sounds not distant. No murmur heard.    No friction  rub. No gallop.     Comments: No peripheral edema Pulmonary:     Effort: Pulmonary effort is normal. No respiratory distress.     Breath sounds: Normal breath sounds.  Skin:    General: Skin is warm and dry.     Findings: No rash.  Psychiatric:        Speech: Speech normal.        Behavior: Behavior normal.        Thought Content: Thought content normal.          Results for orders placed or performed in visit on 05/07/21  CBC  Result Value Ref Range   WBC 8.0 4.0 - 10.5 K/uL   RBC 4.98 4.22 - 5.81 Mil/uL   Platelets 336.0 150.0 - 400.0 K/uL   Hemoglobin 15.6 13.0 - 17.0 g/dL   HCT 44.7 39.0 - 52.0 %   MCV 89.7 78.0 - 100.0 fl   MCHC 34.9 30.0 - 36.0 g/dL   RDW 13.8 11.5 - 15.5 %  Comprehensive metabolic panel  Result Value Ref Range   Sodium 139 135 - 145 mEq/L   Potassium 4.2 3.5 - 5.1 mEq/L   Chloride 102 96 - 112 mEq/L   CO2 28  19 - 32 mEq/L   Glucose, Bld 95 70 - 99 mg/dL   BUN 15 6 - 23 mg/dL   Creatinine, Ser 0.91 0.40 - 1.50 mg/dL   Total Bilirubin 0.8 0.2 - 1.2 mg/dL   Alkaline Phosphatase 96 39 - 117 U/L   AST 29 0 - 37 U/L   ALT 69 (H) 0 - 53 U/L   Total Protein 7.4 6.0 - 8.3 g/dL   Albumin 4.3 3.5 - 5.2 g/dL   GFR 99.80 >60.00 mL/min   Calcium 9.5 8.4 - 10.5 mg/dL  Uric acid  Result Value Ref Range   Uric Acid, Serum 7.6 4.0 - 7.8 mg/dL    Assessment and Plan  Chigger bites Assessment & Plan:  Likely chigger bites. No clear sign of infection.  Reaction locally appears to be allergic. Treat with prednisone taper.  Can also use antihistamine such as Claritin as needed for itching. Call if not significantly improving within 24 to 48 hours or sooner if redness spreading for consideration of possible treatment with antibiotics for recheck   Other orders -     predniSONE; 3 tabs by mouth daily x 3 days, then 2 tabs by mouth daily x 2 days then 1 tab by mouth daily x 2 days  Dispense: 15 tablet; Refill: 0    No follow-ups on file.   Eliezer Lofts, MD

## 2022-04-04 NOTE — Patient Instructions (Signed)
Likely chigger bites. No clear sign of infection.  Reaction locally appears to be allergic. Treat with prednisone taper.  Can also use antihistamine such as Claritin as needed for itching. Call if not significantly improving within 24 to 48 hours or sooner if redness spreading for consideration of possible treatment with antibiotics for recheck

## 2022-04-04 NOTE — Assessment & Plan Note (Signed)
Likely chigger bites. No clear sign of infection.  Reaction locally appears to be allergic. Treat with prednisone taper.  Can also use antihistamine such as Claritin as needed for itching. Call if not significantly improving within 24 to 48 hours or sooner if redness spreading for consideration of possible treatment with antibiotics for recheck

## 2022-05-23 IMAGING — DX DG FOOT COMPLETE 3+V*L*
3 series · 3 of 3 positions shown · non-contrast
Comparison: None.

CLINICAL DATA: Left foot pain.  MTP joint pain.

EXAM:
LEFT FOOT - COMPLETE 3+ VIEW

[foot ap]
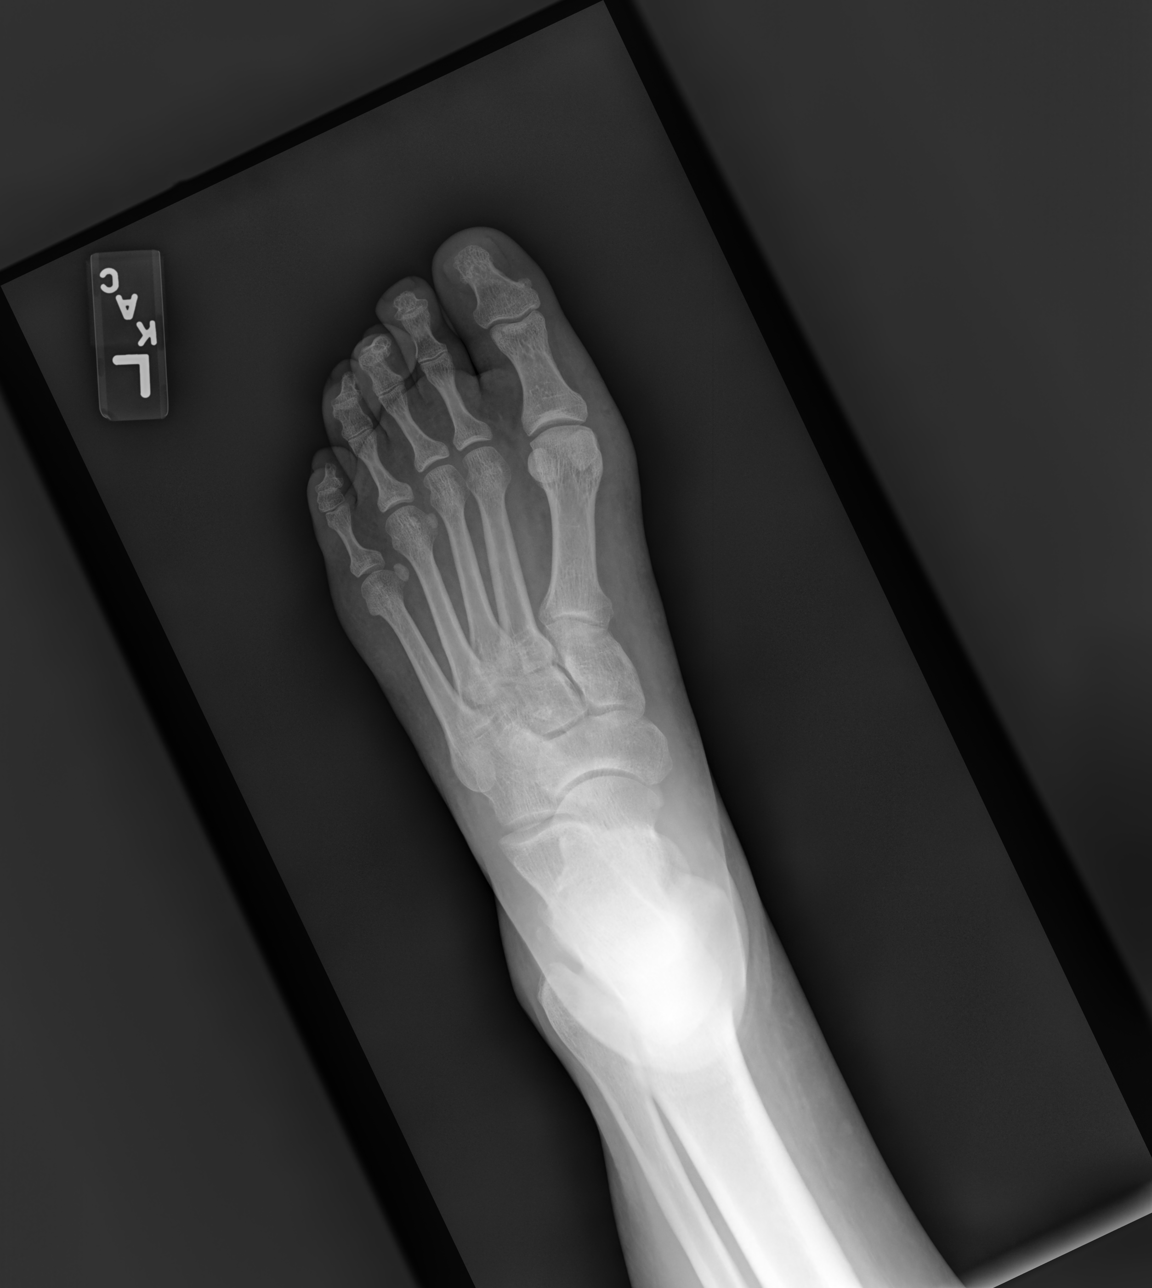

[foot mlo]
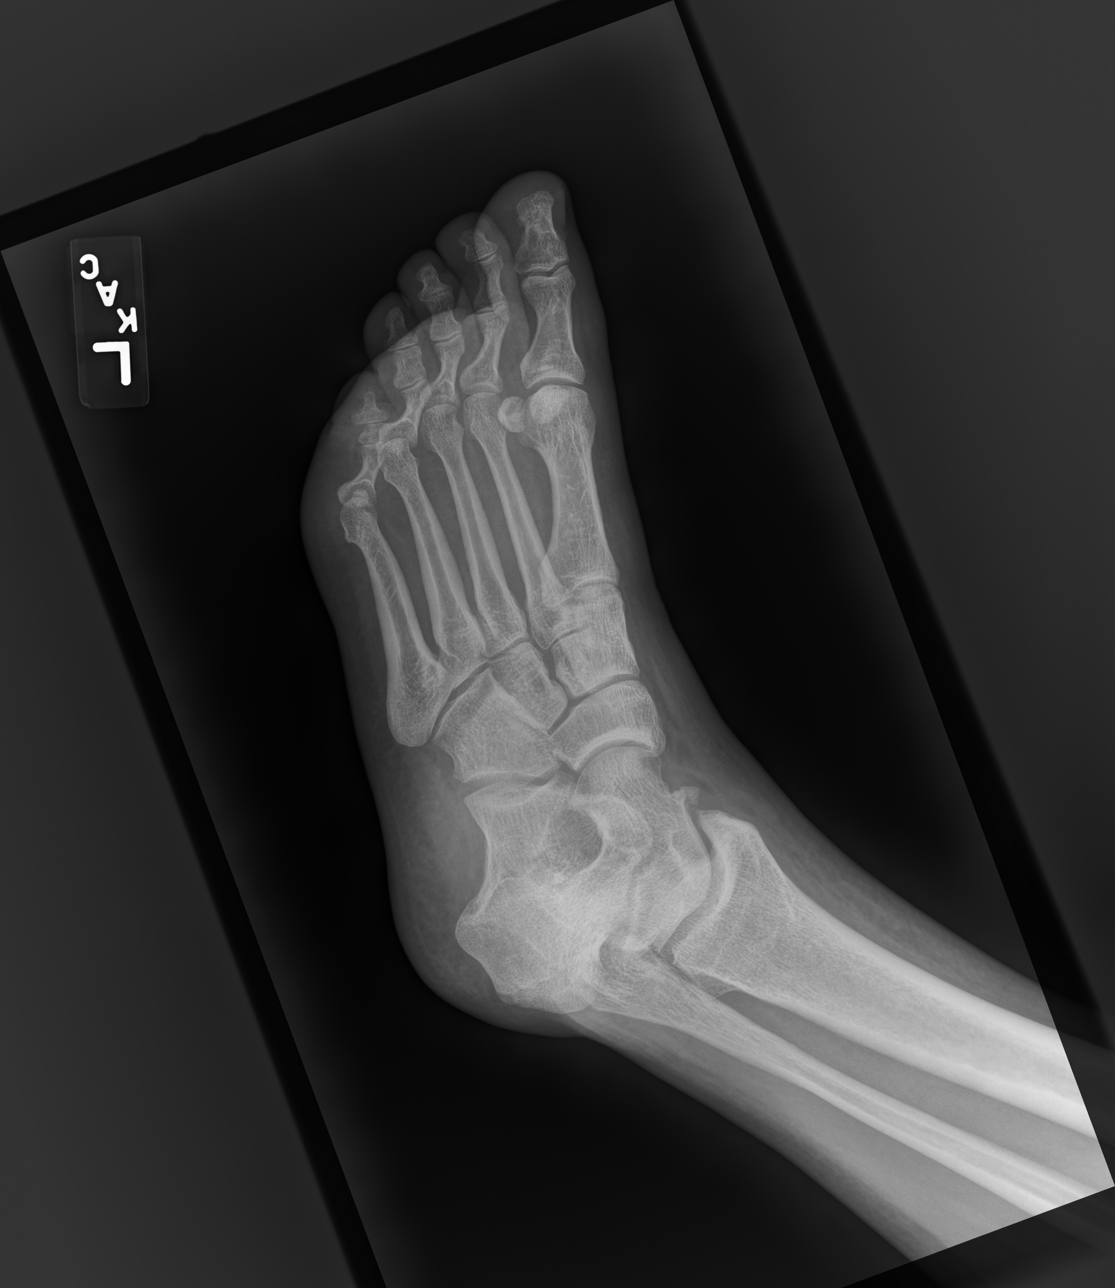

[foot lat]
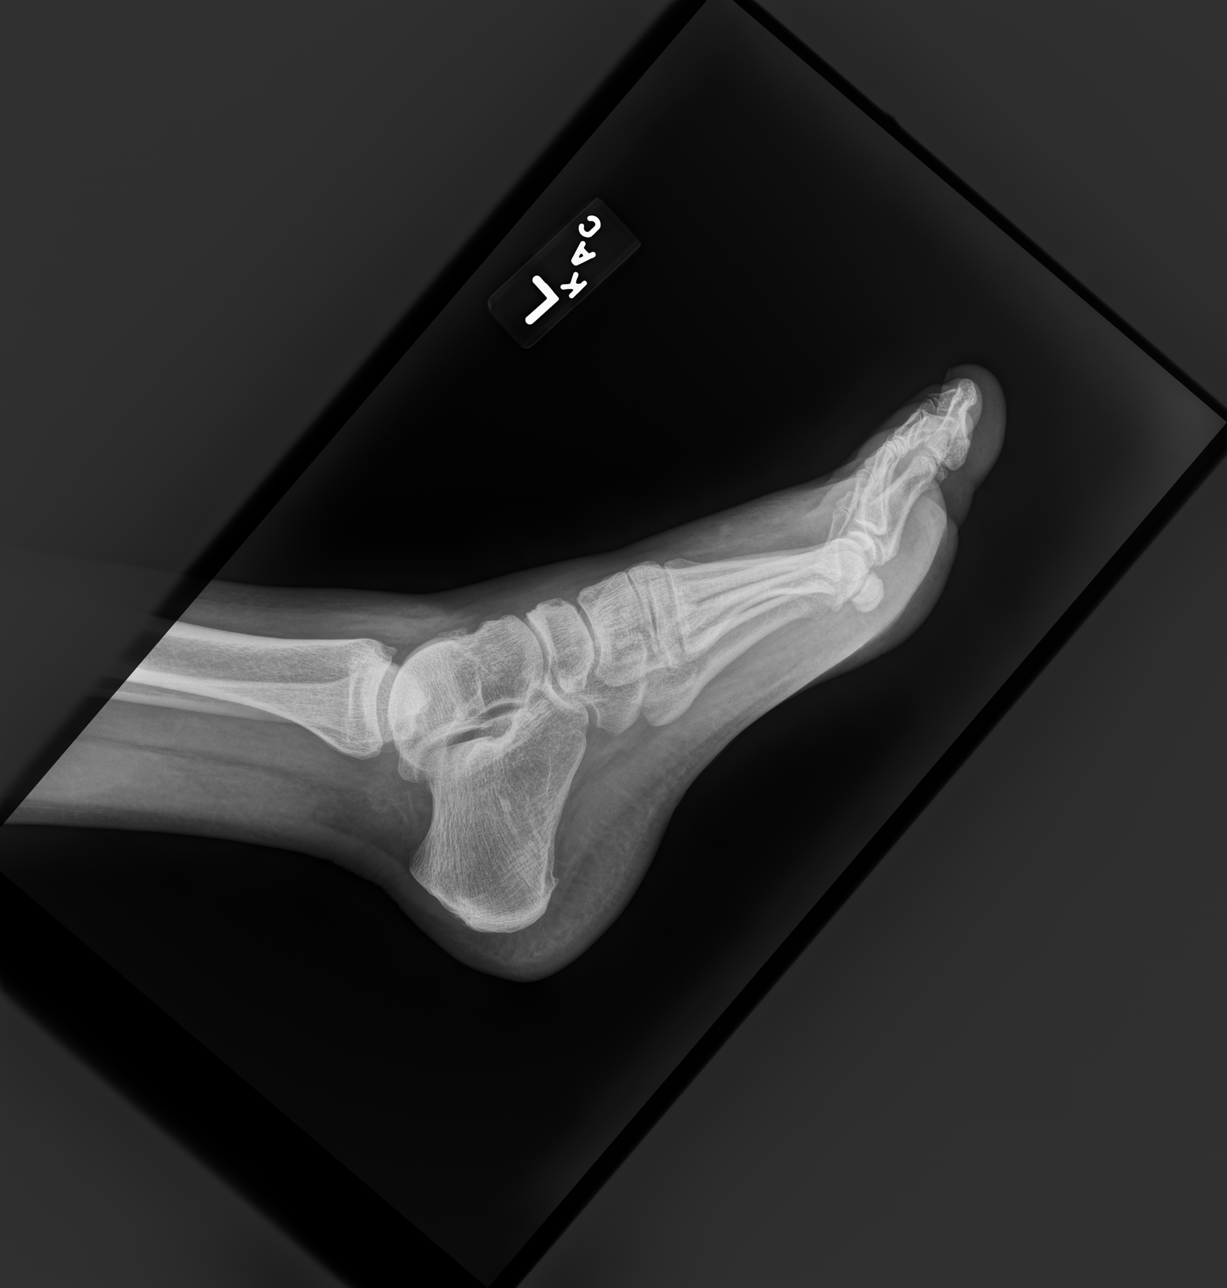

[3 of 3 positions shown; findings below may reference images not displayed]

FINDINGS: There is no evidence of fracture or dislocation. There is no
evidence of arthropathy or other focal bone abnormality. Soft
tissues are unremarkable.
IMPRESSION: Negative.

## 2023-03-19 ENCOUNTER — Ambulatory Visit: Payer: Self-pay | Admitting: Family Medicine

## 2023-03-19 NOTE — Telephone Encounter (Signed)
 Noted. Thanks.

## 2023-03-19 NOTE — Telephone Encounter (Signed)
 Copied from CRM [id number]. Topic: Clinical - Red Word Triage >> Mar 19, 2023  3:04 PM [place] A wrote: [facility] that prompted transfer to Nurse Triage: Patient woke up yesterday with pain on his left heel 1-10;7\  Chief Complaint: left heel pain Symptoms: pain Frequency: constant Pertinent Negatives: Patient denies numbness, tingling, redness, injury Disposition: [] ED /[] Urgent Care (no appt availability in office) / [x] Appointment(In office/virtual)/ []  [facility]/ [] Home Care/ [] Refused Recommended Disposition /[] [facility]/ []  Follow-up with PCP Additional Notes: per protocol apt set for tomorrow.  Care advice given, denies questions, instructed to go to er if becomes worse.   Reason for Disposition  [1] MODERATE pain (e.g., interferes with normal activities, limping) AND [2] present > 3 days  Answer Assessment - Initial Assessment Questions 1. ONSET: "When did the pain start?"      yesterday 2. LOCATION: "Where is the pain located?"      Left heel pain 3. PAIN: "How bad is the pain?"    (Scale 1-10; or mild, moderate, severe)  - MILD (1-3): doesn't interfere with normal activities.   - MODERATE (4-7): interferes with normal activities (e.g., work or school) or awakens from sleep, limping.   - SEVERE (8-10): excruciating pain, unable to do any normal activities, unable to walk.      7/10 4. WORK OR EXERCISE: "Has there been any recent work or exercise that involved this part of the body?"      denies 5. CAUSE: "What do you think is causing the foot pain?"     Yes, has had before; it went away on its own 6. OTHER SYMPTOMS: "Do you have any other symptoms?" (e.g., leg pain, rash, fever, numbness)     denies 7. PREGNANCY: "Is there any chance you are pregnant?" "When was your last menstrual period?"     Denies.  Protocols used: Foot Pain-A-AH

## 2023-03-20 ENCOUNTER — Ambulatory Visit (INDEPENDENT_AMBULATORY_CARE_PROVIDER_SITE_OTHER): Admitting: Internal Medicine

## 2023-03-20 ENCOUNTER — Encounter: Payer: Self-pay | Admitting: Internal Medicine

## 2023-03-20 VITALS — BP 132/80 | HR 72 | Temp 97.9°F | Ht 68.5 in | Wt 226.0 lb

## 2023-03-20 DIAGNOSIS — M7662 Achilles tendinitis, left leg: Secondary | ICD-10-CM | POA: Diagnosis not present

## 2023-03-20 NOTE — Telephone Encounter (Signed)
 Noted---I will check him this morning

## 2023-03-20 NOTE — Assessment & Plan Note (Addendum)
 Early and low on tendon Discussed ice if pain worsens Needs new shoes---no support or arch stability Ibuprofen 400-600mg  up to tid---but start topical diclofenac tid till pain better If not improving---can set up with Dr Patsy Lager for evaluation

## 2023-03-20 NOTE — Progress Notes (Signed)
   Subjective:    Patient ID: Kenneth White, male    DOB: Oct 23, 1975, 48 y.o.   MRN: [id number]  HPI Here due to left heel pain  Sore on back of left heel Noticed it 2 mornings ago Did have prior similar spell some months ago--resolved on its own  No injury Same "old" shoes Worse in the morning--getting out of bed  Took ibuprofen 400mg  this morning--has helped some  No current outpatient medications on file prior to visit.   No current facility-administered medications on file prior to visit.    No Known Allergies  Past Medical History:  Diagnosis Date   Renal stone     Past Surgical History:  Procedure Laterality Date   NO PAST SURGERIES      Family History  Problem Relation Age of Onset   AAA (abdominal aortic aneurysm) Father    Renal Disease Father    Hyperlipidemia Father    Hypertension Father    Colon cancer Neg Hx    Prostate cancer Neg Hx     Social History   Socioeconomic History   Marital status: Married    Spouse name: Not on file   Number of children: Not on file   Years of education: Not on file   Highest education level: Not on file  Occupational History   Not on file  Tobacco Use   Smoking status: Never   Smokeless tobacco: Never  Substance and Sexual Activity   Alcohol use: No   Drug use: No   Sexual activity: Not on file  Other Topics Concern   Not on file  Social History Narrative   Remarried 2016. Son Zac born August 2017. 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   [employer] graduate.   Fan of [employer], [employer]   Enjoys playing soccer   Social Drivers of Corporate investment banker Strain: Not on file  Food Insecurity: Not on file  Transportation Needs: Not on file  Physical Activity: Not on file  Stress: Not on file  Social Connections: Not on file  Intimate Partner Violence: Not on file   Review of  Systems     Objective:   Physical Exam Constitutional:      Appearance: Normal appearance.  Musculoskeletal:     Comments: Fair arches--more on left than right Tenderness over lower Achilles on left---not heel  Neurological:     Mental Status: He is alert.            Assessment & Plan:

## 2023-03-20 NOTE — Patient Instructions (Signed)
 Please get new supportive shoes. Ice the back of your foot if you have bad pain after a while being up. Please start topical diclofenac gel--three times a day till the pain is gone. You can also take 2-3 ibuprofen orally up to three times a day if needed  Achilles Tendinitis  Achilles tendinitis is inflammation of the tough, cord-like band that connects the lower leg muscles to the heel bone (Achilles tendon). This is often caused by using the tendon and ankle joint too much. In most cases, Achilles tendinitis gets better over time with treatment and home care. It can take weeks or months to fully heal. What are the causes? This condition may be caused by: A sudden increase in exercise or activity, such as running. Doing the same exercises or activities, such as jumping, over and over. Not warming up your calf muscles before you exercise. Exercising in shoes that are worn out or not made for exercise. Having arthritis or a bone growth (spur) on the back of your heel. This can rub against the tendon and hurt it. Age-related wear and tear. Tendons become less flexible with age and are more likely to be injured. What are the signs or symptoms? Common symptoms of this condition include: Pain in your Achilles tendon or in the back of your leg, just above your heel. The pain may get worse when you exercise. Stiffness or soreness in the back of your leg. You may feel it most often in the morning. Swelling of the skin over the Achilles tendon. Thickening of the tendon. Trouble standing on tiptoe. How is this diagnosed? This condition is diagnosed based on your symptoms and a physical exam. You may also have tests, such as: X-rays. MRI. Ultrasound. How is this treated? The goal of treatment is to relieve symptoms and help your injury heal. You may need to: Decrease or stop activities that caused the tendinitis. You may be told to switch to low-impact exercises like biking or swimming. Ice the  injured area. Do physical therapy. This may include strengthening and stretching exercises. Take NSAIDs, such as ibuprofen. These can help with pain and swelling. Use supportive shoes, wraps, heel lifts, or a walking boot (air cast). Have surgery. This may be done if your symptoms do not get better with other treatments. Use high-energy waves to start the healing process (extracorporeal shock wave therapy). This is rare. Get an injection of medicines that help with inflammation (corticosteroids). This is rare. Follow these instructions at home: If you have an air cast: Wear the air cast as told by your health care provider. Remove it only as told by your provider. Check the skin around the air cast every day. Tell your provider about any concerns. Loosen the air cast if your toes tingle, become numb, or turn cold and blue. Keep the air cast clean. If the air cast is not waterproof: Do not let it get wet. Cover it with a watertight covering when you take a bath or shower. Managing pain, stiffness, and swelling  If told, put ice on the injured area. If you have a removable air cast, remove it as told by your provider. Put ice in a plastic bag. Place a towel between your skin and the bag. Leave the ice on for 20 minutes, 2-3 times a day. If your skin turns bright red, remove the ice right away to prevent skin damage. The risk of damage is higher if you cannot feel pain, heat, or cold. Move your toes  often to reduce stiffness and swelling. Raise (elevate) your foot above the level of your heart while you are sitting or lying down. Activity Do not do activities that cause pain. Ask your provider when it is safe to drive if you have an air cast on your foot. If you go to physical therapy, do exercises as told by your provider or therapist. Return to your normal activities as told by your provider. Ask your provider what activities are safe for you. General instructions Take over-the-counter  and prescription medicines only as told by your provider. If told, wrap your foot with an elastic bandage or other wrap. This can help to keep your tendon from moving too much while it heals. Your provider will show you how to wrap your foot. Wear supportive shoes or heel lifts only as told by your provider. Contact a health care provider if: Your symptoms get worse. Your pain does not get better with medicine. You have new symptoms that you cannot explain. You have warmth and swelling in your foot. You have a fever. Get help right away if: You hear a sudden popping sound in your Achilles tendon and then have severe pain. You cannot move your toes or foot. You cannot put any weight on your foot. Your foot or toes become numb and look white or blue even after you loosen your bandage or air cast. This information is not intended to replace advice given to you by your health care provider. Make sure you discuss any questions you have with your health care provider. Document Revised: 09/21/2021 Document Reviewed: 09/21/2021 Elsevier Patient Education  2024 [employer].

## 2023-08-11 ENCOUNTER — Ambulatory Visit: Admitting: Family Medicine

## 2023-08-11 ENCOUNTER — Encounter: Payer: Self-pay | Admitting: Family Medicine

## 2023-08-11 VITALS — BP 128/78 | HR 76 | Temp 98.9°F | Ht 68.58 in | Wt 230.6 lb

## 2023-08-11 DIAGNOSIS — Z0000 Encounter for general adult medical examination without abnormal findings: Secondary | ICD-10-CM

## 2023-08-11 DIAGNOSIS — E785 Hyperlipidemia, unspecified: Secondary | ICD-10-CM

## 2023-08-11 DIAGNOSIS — Z8249 Family history of ischemic heart disease and other diseases of the circulatory system: Secondary | ICD-10-CM

## 2023-08-11 DIAGNOSIS — M7050 Other bursitis of knee, unspecified knee: Secondary | ICD-10-CM

## 2023-08-11 DIAGNOSIS — Z7189 Other specified counseling: Secondary | ICD-10-CM

## 2023-08-11 NOTE — Patient Instructions (Addendum)
 I would ice for 5 minutes at a time.  Use a soft knee pad if needed.  Try taking ibuprofen twice a day with food.  If not any better, then ask about seeing Dr. Watt.    Let me know what you want to do for colon cancer screening.    Ask the front about a fasting lab visit.  Check with your insurance to see if they will cover the shingles shot.  Take care.  Glad to see you.

## 2023-08-11 NOTE — Progress Notes (Unsigned)
 CPE- See plan.  Routine anticipatory guidance given to patient.  See health maintenance.  The possibility exists that previously documented standard health maintenance information may have been brought forward from a previous encounter into this note.  If needed, that same information has been updated to reflect the current situation based on today's encounter.    He had a tetanus shot in 2016. Flu shot encouraged. Covid vaccine d/w pt.  Encouraged.   PNA and shingles d/w pt.   Prostate cancer screening not due, d/w pt.   D/w patient mz:neupnwd for colon cancer screening, including IFOB vs. Cologuard vs colonoscopy.  Risks and benefits of both were discussed and patient voiced understanding.  Pt elects to consider.  HIV and HCV screening discussed with patient. He can consider. Diet and exercise discussed with patient.  Encouraged.  Advanced directive discussed with patient. Wife designated if patient were incapacitated. D/w pt about getting AAA screening later in life.  FH noted, father dx'd in his 47s.    Lesion inferior to the R knee.  Transilluminates typically for infrapatellar bursitis. No pain.  No fevers.  It enlarged to current size relatively quickly but then had been stable.    PMH and SH reviewed  Meds, vitals, and allergies reviewed.   ROS: Per HPI.  Unless specifically indicated otherwise in HPI, the patient denies:  General: fever. Eyes: acute vision changes ENT: sore throat Cardiovascular: chest pain Respiratory: SOB GI: vomiting GU: dysuria Musculoskeletal: acute back pain Derm: acute rash Neuro: acute motor dysfunction Psych: worsening mood Endocrine: polydipsia Heme: bleeding Allergy: hayfever  GEN: nad, alert and oriented HEENT: mucous membranes moist NECK: supple w/o LA CV: rrr. PULM: ctab, no inc wob ABD: soft, +bs EXT: no edema SKIN: no acute rash

## 2023-08-13 ENCOUNTER — Other Ambulatory Visit (INDEPENDENT_AMBULATORY_CARE_PROVIDER_SITE_OTHER)

## 2023-08-13 ENCOUNTER — Ambulatory Visit: Payer: Self-pay | Admitting: Family Medicine

## 2023-08-13 DIAGNOSIS — E785 Hyperlipidemia, unspecified: Secondary | ICD-10-CM

## 2023-08-13 DIAGNOSIS — M7050 Other bursitis of knee, unspecified knee: Secondary | ICD-10-CM | POA: Insufficient documentation

## 2023-08-13 DIAGNOSIS — R739 Hyperglycemia, unspecified: Secondary | ICD-10-CM

## 2023-08-13 DIAGNOSIS — Z8249 Family history of ischemic heart disease and other diseases of the circulatory system: Secondary | ICD-10-CM | POA: Insufficient documentation

## 2023-08-13 DIAGNOSIS — R7989 Other specified abnormal findings of blood chemistry: Secondary | ICD-10-CM

## 2023-08-13 LAB — LIPID PANEL
Cholesterol: 213 mg/dL — ABNORMAL HIGH (ref 0–200)
HDL: 42.1 mg/dL (ref >39.00)
LDL Cholesterol: 137 mg/dL — ABNORMAL HIGH (ref 0–99)
NonHDL: 170.86
Total CHOL/HDL Ratio: 5
Triglycerides: 170 mg/dL — ABNORMAL HIGH (ref 0.0–149.0)
VLDL: 34 mg/dL (ref 0.0–40.0)

## 2023-08-13 LAB — COMPREHENSIVE METABOLIC PANEL WITH GFR
ALT: 57 U/L — ABNORMAL HIGH (ref 0–53)
AST: 30 U/L (ref 0–37)
Albumin: 4.3 g/dL (ref 3.5–5.2)
Alkaline Phosphatase: 81 U/L (ref 39–117)
BUN: 13 mg/dL (ref 6–23)
CO2: 29 meq/L (ref 19–32)
Calcium: 9.5 mg/dL (ref 8.4–10.5)
Chloride: 103 meq/L (ref 96–112)
Creatinine, Ser: 1 mg/dL (ref 0.40–1.50)
GFR: 87.72 mL/min (ref >60.00)
Glucose, Bld: 126 mg/dL — ABNORMAL HIGH (ref 70–99)
Potassium: 4.6 meq/L (ref 3.5–5.1)
Sodium: 140 meq/L (ref 135–145)
Total Bilirubin: 0.9 mg/dL (ref 0.2–1.2)
Total Protein: 7.1 g/dL (ref 6.0–8.3)

## 2023-08-13 NOTE — Assessment & Plan Note (Addendum)
 He had a tetanus shot in 2016. Flu shot encouraged.  Rationale for routine vaccination discussed with patient. Covid vaccine d/w pt.  Encouraged.   PNA and shingles d/w pt.   Prostate cancer screening not due, d/w pt.   D/w patient mz:neupnwd for colon cancer screening, including IFOB vs. Cologuard vs colonoscopy.  Risks and benefits were discussed and patient voiced understanding.  Pt elects to consider.  HIV and HCV screening discussed with patient. He can consider. Diet and exercise discussed with patient.  Encouraged.  Advanced directive discussed with patient. Wife designated if patient were incapacitated. D/w pt about getting AAA screening later in life.  FH noted, father dx'd in his 73s.

## 2023-08-13 NOTE — Assessment & Plan Note (Signed)
 Would not attempt aspiration at this point as it does not appear infected.  Rationale discussed with patient.  I would ice for 5 minutes at a time.  Use a soft knee pad if needed.  Try taking ibuprofen twice a day with food.  If not any better, then ask about seeing Dr. Watt.

## 2023-08-13 NOTE — Assessment & Plan Note (Signed)
 D/w pt about getting AAA screening later in life.  FH noted, father dx'd in his 25s.

## 2023-08-13 NOTE — Assessment & Plan Note (Signed)
Advanced directive discussed with patient.  Wife designated if patient were incapacitated. ?

## 2023-08-22 ENCOUNTER — Encounter: Payer: Self-pay | Admitting: *Deleted

## 2023-08-27 ENCOUNTER — Ambulatory Visit: Payer: Self-pay | Admitting: Family Medicine

## 2023-08-27 ENCOUNTER — Other Ambulatory Visit (INDEPENDENT_AMBULATORY_CARE_PROVIDER_SITE_OTHER)

## 2023-08-27 DIAGNOSIS — R739 Hyperglycemia, unspecified: Secondary | ICD-10-CM

## 2023-08-27 DIAGNOSIS — E119 Type 2 diabetes mellitus without complications: Secondary | ICD-10-CM | POA: Insufficient documentation

## 2023-08-27 LAB — HEMOGLOBIN A1C: Hgb A1c MFr Bld: 6.7 % — ABNORMAL HIGH (ref 4.6–6.5)

## 2023-09-02 ENCOUNTER — Ambulatory Visit: Admitting: Family Medicine

## 2023-09-11 ENCOUNTER — Ambulatory Visit: Admitting: Family Medicine

## 2023-09-11 ENCOUNTER — Encounter: Payer: Self-pay | Admitting: Family Medicine

## 2023-09-11 VITALS — BP 144/82 | HR 105 | Temp 98.4°F | Ht 68.58 in | Wt 228.0 lb

## 2023-09-11 DIAGNOSIS — R7989 Other specified abnormal findings of blood chemistry: Secondary | ICD-10-CM

## 2023-09-11 DIAGNOSIS — E119 Type 2 diabetes mellitus without complications: Secondary | ICD-10-CM

## 2023-09-11 NOTE — Progress Notes (Signed)
 Diabetes:  No meds Diet and exercise d/w pt.   He is going to get back to [facility] exercise group and is hoping to play soccer.  Path/phys d/w pt.   Low carb diet d/w pt.  Handout given and discussed.    LFT elevation.  No h/o hepatitis.  No jaundice.  D/w pt about possible fatty liver, u/s ordered.    D/w pt about routine vaccination, regardless of DM2.   PMH and SH reviewed  Meds, vitals, and allergies reviewed.   ROS: Per HPI unless specifically indicated in ROS section   GEN: nad, alert and oriented HEENT: mucous membranes moist NECK: supple w/o LA CV: rrr. PULM: ctab, no inc wob ABD: soft, +bs EXT: no edema SKIN: no acute rash  The 10-year ASCVD risk score (Arnett DK, et al., 2019) is: 10.4%   Values used to calculate the score:     Age: 48 years     Clincally relevant sex: Male     Is Non-Hispanic African American: No     Diabetic: Yes     Tobacco smoker: No     Systolic Blood Pressure: 144 mmHg     Is BP treated: No     HDL Cholesterol: 42.1 mg/dL     Total Cholesterol: 213 mg/dL  35 minutes were devoted to patient care in this encounter (this includes time spent reviewing the patient's file/history, interviewing and examining the patient, counseling/reviewing plan with patient).

## 2023-09-11 NOTE — Patient Instructions (Addendum)
 Let me know if you can't get the liver ultrasound covered.   Play soccer, use the eat right diet, and recheck labs in about 3 months before a visit.   I would get the routine vaccines that we talked about.   Take care.  Glad to see you.

## 2023-09-15 DIAGNOSIS — R7989 Other specified abnormal findings of blood chemistry: Secondary | ICD-10-CM | POA: Insufficient documentation

## 2023-09-15 NOTE — Assessment & Plan Note (Signed)
 Discussed exercise, use the eat right diet, and recheck labs in about 3 months before a visit.

## 2023-09-15 NOTE — Assessment & Plan Note (Signed)
 LFT elevation.  No h/o hepatitis.  No jaundice.  D/w pt about possible fatty liver, u/s ordered.

## 2023-10-13 ENCOUNTER — Telehealth: Payer: Self-pay | Admitting: Family Medicine

## 2023-10-13 NOTE — Telephone Encounter (Signed)
 Notification about scheduling liver u/s:   ----- Message ----- From: Landy Gaylon HERO, CMA Sent: 10/13/2023   3:39 PM EDT To: Arlyss GORMAN Solian, MD Subject: US  Abd                                          Patient never scheduled appt.    He was contacted by myself and made aware of where to call to schedule and then multiple times by [facility] and he did not call back to schedule with them.  =========================== I will defer scheduling to patient.
